# Patient Record
Sex: Male | Born: 1995 | Race: White | Hispanic: No | Marital: Single | Smoking: Never smoker
Health system: Southern US, Community
[De-identification: ages and names within clinical notes are randomized; demographics above are authoritative.]

## PROBLEM LIST (undated history)

## (undated) DIAGNOSIS — J302 Other seasonal allergic rhinitis: Secondary | ICD-10-CM

## (undated) DIAGNOSIS — R011 Cardiac murmur, unspecified: Secondary | ICD-10-CM

## (undated) DIAGNOSIS — J45909 Unspecified asthma, uncomplicated: Secondary | ICD-10-CM

---

## 2020-05-27 ENCOUNTER — Ambulatory Visit (HOSPITAL_COMMUNITY)
Admission: EM | Admit: 2020-05-27 | Discharge: 2020-05-27 | Disposition: A | Discharge status: Home / Self Care | Payer: HRSA Program | Attending: Family Medicine | Admitting: Family Medicine

## 2020-05-27 ENCOUNTER — Encounter (HOSPITAL_COMMUNITY): Payer: Self-pay

## 2020-05-27 ENCOUNTER — Other Ambulatory Visit: Payer: Self-pay

## 2020-05-27 DIAGNOSIS — R6883 Chills (without fever): Secondary | ICD-10-CM | POA: Diagnosis not present

## 2020-05-27 DIAGNOSIS — R519 Headache, unspecified: Secondary | ICD-10-CM | POA: Diagnosis present

## 2020-05-27 DIAGNOSIS — R112 Nausea with vomiting, unspecified: Secondary | ICD-10-CM | POA: Insufficient documentation

## 2020-05-27 DIAGNOSIS — R05 Cough: Secondary | ICD-10-CM | POA: Diagnosis not present

## 2020-05-27 DIAGNOSIS — R0781 Pleurodynia: Secondary | ICD-10-CM | POA: Diagnosis not present

## 2020-05-27 DIAGNOSIS — Z20822 Contact with and (suspected) exposure to covid-19: Secondary | ICD-10-CM | POA: Diagnosis not present

## 2020-05-27 DIAGNOSIS — R059 Cough, unspecified: Secondary | ICD-10-CM

## 2020-05-27 DIAGNOSIS — R197 Diarrhea, unspecified: Secondary | ICD-10-CM | POA: Insufficient documentation

## 2020-05-27 HISTORY — DX: Other seasonal allergic rhinitis: J30.2

## 2020-05-27 HISTORY — DX: Unspecified asthma, uncomplicated: J45.909

## 2020-05-27 HISTORY — DX: Cardiac murmur, unspecified: R01.1

## 2020-05-27 MED ORDER — ONDANSETRON 4 MG PO TBDP
ORAL_TABLET | ORAL | Status: AC
  Filled 2020-05-27: qty 1

## 2020-05-27 MED ORDER — ONDANSETRON 4 MG PO TBDP
4.0000 mg | ORAL_TABLET | Freq: Once | ORAL | Status: AC
  Administered 2020-05-27: 4 mg via ORAL

## 2020-05-27 MED ORDER — BENZONATATE 100 MG PO CAPS
100.0000 mg | ORAL_CAPSULE | Freq: Three times a day (TID) | ORAL | 0 refills | Status: DC
Start: 2020-05-27 — End: 2020-08-29

## 2020-05-27 MED ORDER — ONDANSETRON HCL 4 MG PO TABS
4.0000 mg | ORAL_TABLET | Freq: Four times a day (QID) | ORAL | 0 refills | Status: DC
Start: 2020-05-27 — End: 2020-08-29

## 2020-05-27 NOTE — ED Provider Notes (Signed)
[facility]   [id number] 05/27/20 Arrival Time: 1328   CC: COVID symptoms  SUBJECTIVE: History from: patient.  Dylan Holloway is a 24 y.o. male who presents with abrupt onset of nasal congestion, PND, cough, n/v/d, chills, body aches, headaches since yesterday. Denies having flu or Covid vaccines in the last year. Denies sick exposure to COVID, flu or strep.  Denies recent travel. Has not taken any OTC medications. There are no aggravating symptoms. Denies previous symptoms in the past.   Denies fever, sinus pain, rhinorrhea, sore throat, SOB, wheezing, chest pain, nausea, changes in bowel or bladder habits.    ROS: As per HPI.  All other pertinent ROS negative.     Past Medical History:  Diagnosis Date  . Asthma   . Heart murmur   . Seasonal allergies    History reviewed. No pertinent surgical history. No Known Allergies No current facility-administered medications on file prior to encounter.   Current Outpatient Medications on File Prior to Encounter  Medication Sig Dispense Refill  . cetirizine (ZYRTEC) 10 MG tablet Take 10 mg by mouth daily.     Social History   Socioeconomic History  . Marital status: Single    Spouse name: Not on file  . Number of children: Not on file  . Years of education: Not on file  . Highest education level: Not on file  Occupational History  . Not on file  Tobacco Use  . Smoking status: Never Smoker  . Smokeless tobacco: Never Used  Substance and Sexual Activity  . Alcohol use: Yes    Alcohol/week: 3.0 standard drinks    Types: 3 Cans of beer per week  . Drug use: Yes    Types: Marijuana  . Sexual activity: Not on file  Other Topics Concern  . Not on file  Social History Narrative  . Not on file   Social Determinants of Health   Financial Resource Strain:   . Difficulty of Paying Living Expenses:   Food Insecurity:   . Worried About Programme researcher, broadcasting/film/video in the Last Year:   . Barista in the Last Year:     Transportation Needs:   . Freight forwarder (Medical):   Marland Kitchen Lack of Transportation (Non-Medical):   Physical Activity:   . Days of Exercise per Week:   . Minutes of Exercise per Session:   Stress:   . Feeling of Stress :   Social Connections:   . Frequency of Communication with Friends and Family:   . Frequency of Social Gatherings with Friends and Family:   . Attends Religious Services:   . Active Member of Clubs or Organizations:   . Attends Banker Meetings:   Marland Kitchen Marital Status:   Intimate Partner Violence:   . Fear of Current or Ex-Partner:   . Emotionally Abused:   Marland Kitchen Physically Abused:   . Sexually Abused:    Family History  Problem Relation Age of Onset  . Migraines Mother   . Healthy Father     OBJECTIVE:  Vitals:   05/27/20 1344 05/27/20 1346  BP: 129/71   Pulse: 72   Resp: 16   Temp: 98.2 F (36.8 C)   TempSrc: Oral   SpO2: 100%   Weight:  150 lb (68 kg)  Height:  5\' 11"  (1.803 m)     General appearance: alert; ill appearing, but nontoxic; speaking in full sentences and tolerating own secretions, shivering as I enter the room HEENT: NCAT;  Ears: EACs clear, TMs pearly gray; Eyes: PERRL.  EOM grossly intact. Sinuses: nontender; Nose: nares patent without rhinorrhea, Throat: oropharynx clear, tonsils non erythematous or enlarged, uvula midline  Neck: supple without LAD Lungs: unlabored respirations, symmetrical air entry; cough: mild; no respiratory distress; CTAB Heart: regular rate and rhythm.  Radial pulses 2+ symmetrical bilaterally Skin: warm and dry Psychological: alert and cooperative; normal mood and affect  LABS:  No results found for this or any previous visit (from the past 24 hour(s)).   ASSESSMENT & PLAN:  1. Nonintractable headache, unspecified chronicity pattern, unspecified headache type   2. Chills   3. Non-intractable vomiting with nausea, unspecified vomiting type   4. Diarrhea, unspecified type   5. Rib pain   6.  Cough     Meds ordered this encounter  Medications  . ondansetron (ZOFRAN-ODT) disintegrating tablet 4 mg  . ondansetron (ZOFRAN) 4 MG tablet    Sig: Take 1 tablet (4 mg total) by mouth every 6 (six) hours.    Dispense:  12 tablet    Refill:  0    Order Specific Question:   Supervising Provider    Answer:   Chase Picket [id number]  . benzonatate (TESSALON) 100 MG capsule    Sig: Take 1 capsule (100 mg total) by mouth every 8 (eight) hours.    Dispense:  21 capsule    Refill:  0    Order Specific Question:   Supervising Provider    Answer:   Chase Picket [id number]    Rapid flu negative. Prescribed Zofran Prescribed tessalon perles  COVID testing ordered.  It will take between 1-2 days for test results.  Someone will contact you regarding abnormal results.    Patient should remain in quarantine until they have received Covid results.  If negative you may resume normal activities (go back to work/school) while practicing hand hygiene, social distance, and mask wearing.  If positive, patient should remain in quarantine for 10 days from symptom onset AND greater than 72 hours after symptoms resolution (absence of fever without the use of fever-reducing medication and improvement in respiratory symptoms), whichever is longer Get plenty of rest and push fluids Use OTC zyrtec for nasal congestion, runny nose, and/or sore throat Use OTC flonase for nasal congestion and runny nose Use medications daily for symptom relief Use OTC medications like ibuprofen or tylenol as needed fever or pain Call or go to the ED if you have any new or worsening symptoms such as fever, worsening cough, shortness of breath, chest tightness, chest pain, turning blue, changes in mental status.  Reviewed expectations re: course of current medical issues. Questions answered. Outlined signs and symptoms indicating need for more acute intervention. Patient verbalized understanding. After Visit Summary  given.         Faustino Congress, NP 05/27/20 [phone]

## 2020-05-27 NOTE — ED Triage Notes (Signed)
Pt c/o dizziness, N/V/D, chills, migraine, 7/10 left rib pain with inspiration on and off all started yesterday. Pt states vomited 3-4 times today. Pt states had diarrhea twice today. Pt has non labored breathing. Pt skin color WNL.

## 2020-05-27 NOTE — Discharge Instructions (Signed)
Your COVID test is pending.  You should self quarantine until the test result is back.    Take Tylenol as needed for fever or discomfort.  Rest and keep yourself hydrated.    Go to the emergency department if you develop shortness of breath, severe diarrhea, high fever not relieved by Tylenol or ibuprofen, or other concerning symptoms.    

## 2020-05-28 LAB — SARS CORONAVIRUS 2 (TAT 6-24 HRS): SARS Coronavirus 2: NEGATIVE

## 2020-05-31 LAB — POC INFLUENZA A AND B ANTIGEN (URGENT CARE ONLY)
Influenza A Ag: NEGATIVE
Influenza B Ag: NEGATIVE

## 2020-08-29 ENCOUNTER — Ambulatory Visit (INDEPENDENT_AMBULATORY_CARE_PROVIDER_SITE_OTHER): Payer: 59

## 2020-08-29 ENCOUNTER — Ambulatory Visit
Admission: EM | Admit: 2020-08-29 | Discharge: 2020-08-29 | Disposition: A | Discharge status: Home / Self Care | Payer: 59

## 2020-08-29 DIAGNOSIS — W540XXA Bitten by dog, initial encounter: Secondary | ICD-10-CM | POA: Diagnosis not present

## 2020-08-29 DIAGNOSIS — S61452A Open bite of left hand, initial encounter: Secondary | ICD-10-CM

## 2020-08-29 DIAGNOSIS — M25442 Effusion, left hand: Secondary | ICD-10-CM

## 2020-08-29 MED ORDER — IBUPROFEN 800 MG PO TABS: 800.0000 mg | ORAL_TABLET | Freq: Three times a day (TID) | ORAL | 0 refills | Status: AC

## 2020-08-29 MED ORDER — AMOXICILLIN-POT CLAVULANATE 875-125 MG PO TABS
1.0000 | ORAL_TABLET | Freq: Two times a day (BID) | ORAL | 0 refills | Status: AC
Start: 2020-08-29 — End: ?

## 2020-08-29 NOTE — ED Triage Notes (Signed)
Pt states bite by his dog to his left hand around 3am. Bleeding controlled with swelling noted. Pt c/o tingling to ring finger and can't make a fit.

## 2020-08-29 NOTE — Discharge Instructions (Addendum)
X-ray without fracture Use anti-inflammatories for pain/swelling. You may take up to 800 mg Ibuprofen every 8 hours with food. You may supplement Ibuprofen with Tylenol [phone] mg every 8 hours.  Ice Augmentin twice daily x 1 week for infection Keep clean and dry Follow up with hand if not reganing Range of motion Follow up here or emergency if worsening/signs of infection

## 2020-08-30 NOTE — ED Provider Notes (Signed)
[facility]    CSN: [id number] Arrival date & time: 08/29/20  1812      History   Chief Complaint Chief Complaint  Patient presents with  . Animal Bite    HPI Dylan Holloway is a 24 y.o. male presenting today for evaluation of animal bite.  Patient reports that he was bitten by his dog last night.  He has a pit bull.  Dog is up-to-date on vaccines.  He is concerned as he has had pain swelling and difficulty bending his finger.  Has had some slight tingling around this wound as well.  Denies any drainage.  Believes tetanus up-to-date, but unsure of exact date.  HPI  Past Medical History:  Diagnosis Date  . Asthma   . Heart murmur   . Seasonal allergies     There are no problems to display for this patient.   History reviewed. No pertinent surgical history.     Home Medications    Prior to Admission medications   Medication Sig Start Date End Date Taking? Authorizing Provider  doxycycline (VIBRAMYCIN) 100 MG capsule Take 100 mg by mouth 2 (two) times daily.   Yes [provider]  amoxicillin-clavulanate (AUGMENTIN) 875-125 MG tablet Take 1 tablet by mouth every 12 (twelve) hours. 08/29/20   Dekisha Mesmer C, PA-C  cetirizine (ZYRTEC) 10 MG tablet Take 10 mg by mouth daily.    [provider]  ibuprofen (ADVIL) 800 MG tablet Take 1 tablet (800 mg total) by mouth 3 (three) times daily. 08/29/20   Maralee Higuchi, Junius Creamer, PA-C    Family History Family History  Problem Relation Age of Onset  . Migraines Mother   . Healthy Father     Social History Social History   Tobacco Use  . Smoking status: Never Smoker  . Smokeless tobacco: Never Used  Vaping Use  . Vaping Use: Every day  . Substances: Nicotine  Substance Use Topics  . Alcohol use: Yes    Alcohol/week: 3.0 standard drinks    Types: 3 Cans of beer per week  . Drug use: Yes    Types: Marijuana     Allergies   Patient has no known allergies.   Review of Systems Review of  Systems  Constitutional: Negative for fatigue and fever.  Eyes: Negative for redness, itching and visual disturbance.  Respiratory: Negative for shortness of breath.   Cardiovascular: Negative for chest pain and leg swelling.  Gastrointestinal: Negative for nausea and vomiting.  Musculoskeletal: Positive for arthralgias and joint swelling. Negative for myalgias.  Skin: Positive for wound. Negative for color change and rash.  Neurological: Negative for dizziness, syncope, weakness, light-headedness and headaches.     Physical Exam Triage Vital Signs ED Triage Vitals  Enc Vitals Group     BP 08/29/20 2015 130/74     Pulse Rate 08/29/20 2015 (!) 55     Resp 08/29/20 2015 16     Temp 08/29/20 2015 98.5 F (36.9 C)     Temp Source 08/29/20 2015 Oral     SpO2 08/29/20 2015 98 %     Weight --      Height --      Head Circumference --      Peak Flow --      Pain Score 08/29/20 2030 7     Pain Loc --      Pain Edu? --      Excl. in GC? --    No data found.  Updated Vital  Signs BP 130/74 (BP Location: Right Arm)   Pulse (!) 55   Temp 98.5 F (36.9 C) (Oral)   Resp 16   SpO2 98%   Visual Acuity Right Eye Distance:   Left Eye Distance:   Bilateral Distance:    Right Eye Near:   Left Eye Near:    Bilateral Near:     Physical Exam Vitals and nursing note reviewed.  Constitutional:      Appearance: He is well-developed.     Comments: No acute distress  HENT:     Head: Normocephalic and atraumatic.     Nose: Nose normal.  Eyes:     Conjunctiva/sclera: Conjunctivae normal.  Cardiovascular:     Rate and Rhythm: Normal rate.  Pulmonary:     Effort: Pulmonary effort is normal. No respiratory distress.  Abdominal:     General: There is no distension.  Musculoskeletal:        General: Normal range of motion.     Cervical back: Neck supple.     Comments: Left hand: 1 cm laceration noted over distal fourth MCP, no visualized tendons, swelling or about wound, limited  range of motion at MCP joint, full active range of motion at PIP and DIP Radial pulse 2+  Skin:    General: Skin is warm and dry.  Neurological:     Mental Status: He is alert and oriented to person, place, and time.      UC Treatments / Results  Labs (all labs ordered are listed, but only abnormal results are displayed) Labs Reviewed - No data to display  EKG   Radiology DG Hand Complete Left  Result Date: 08/29/2020 CLINICAL DATA:  Dog bite swelling difficulty clenching EXAM: LEFT HAND - COMPLETE 3+ VIEW COMPARISON:  None. FINDINGS: There is no evidence of fracture or dislocation. There is no evidence of arthropathy or other focal bone abnormality. Soft tissue swelling seen around the fourth digit is seen. No radiopaque foreign body. IMPRESSION: No acute fracture or radiopaque foreign body. Electronically Signed   By: Jonna Clark M.D.   On: 08/29/2020 21:08    Procedures Procedures (including critical care time)  Medications Ordered in UC Medications - No data to display  Initial Impression / Assessment and Plan / UC Course  I have reviewed the triage vital signs and the nursing notes.  Pertinent labs & imaging results that were available during my care of the patient were reviewed by me and considered in my medical decision making (see chart for details).     X-ray negative for acute fracture, suspect range of motion likely more from soft tissue swelling, but recommended to follow-up with hand if not regaining range of motion at MCP joint.  Wound cleansed with saline and Shur-Clens, Steri-Strips applied.  Initiating on Augmentin.  No concern for rabies at this time.  Ice and anti-inflammatories.  Discussed strict return precautions. Patient verbalized understanding and is agreeable with plan.  Final Clinical Impressions(s) / UC Diagnoses   Final diagnoses:  Open wound of left hand due to dog bite     Discharge Instructions     X-ray without fracture Use  anti-inflammatories for pain/swelling. You may take up to 800 mg Ibuprofen every 8 hours with food. You may supplement Ibuprofen with Tylenol [phone] every 8 hours.  Ice Augmentin twice daily x 1 week for infection Keep clean and dry Follow up with hand if not reganing Range of motion Follow up here or emergency if worsening/signs of  infection    ED Prescriptions    Medication Sig Dispense Auth. Provider   amoxicillin-clavulanate (AUGMENTIN) 875-125 MG tablet Take 1 tablet by mouth every 12 (twelve) hours. 14 tablet [place] Leap C, PA-C   ibuprofen (ADVIL) 800 MG tablet Take 1 tablet (800 mg total) by mouth 3 (three) times daily. 21 tablet Marisol Glazer, [place] C, PA-C     PDMP not reviewed this encounter.   Paden Kuras, [place] C, PA-C 08/30/20 1022

## 2022-01-08 IMAGING — DX DG HAND COMPLETE 3+V*L*
3 series · 3 of 3 positions shown · non-contrast
Comparison: None.

CLINICAL DATA: Dog bite swelling difficulty clenching

EXAM:
LEFT HAND - COMPLETE 3+ VIEW

[hand pa]
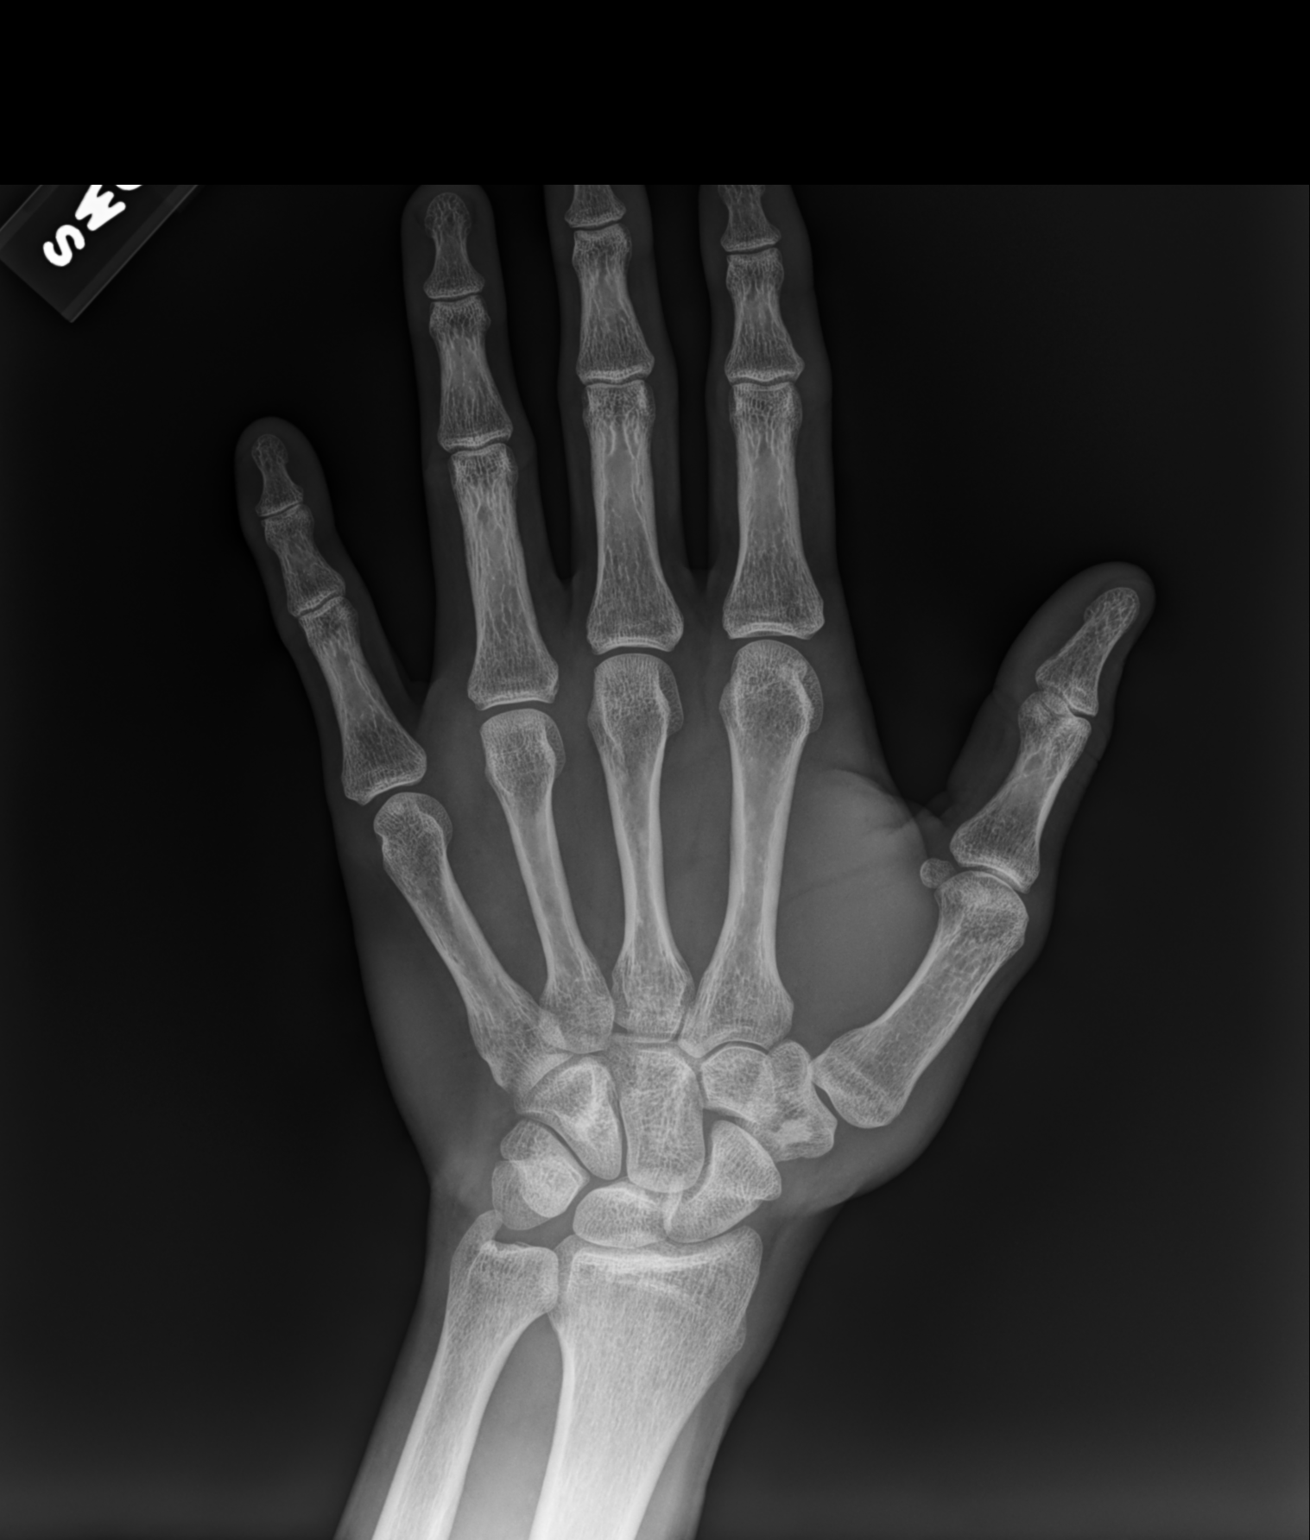

[hand mlo]
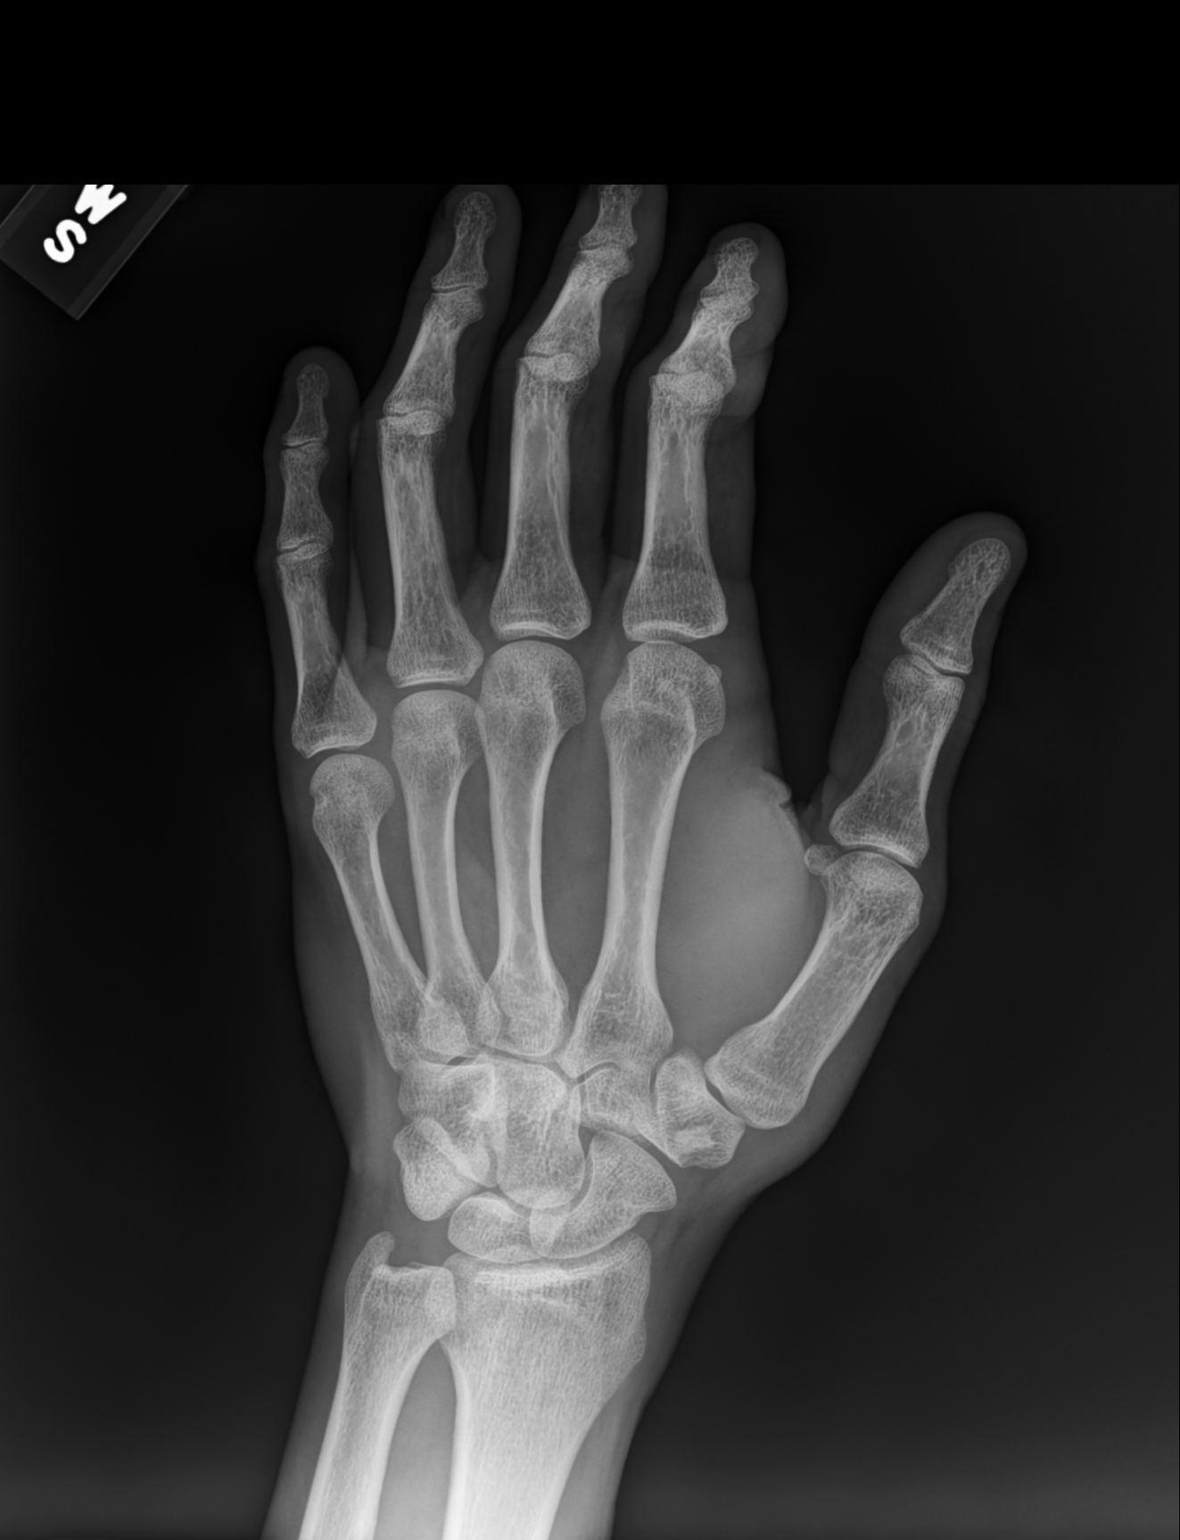

[hand lat]
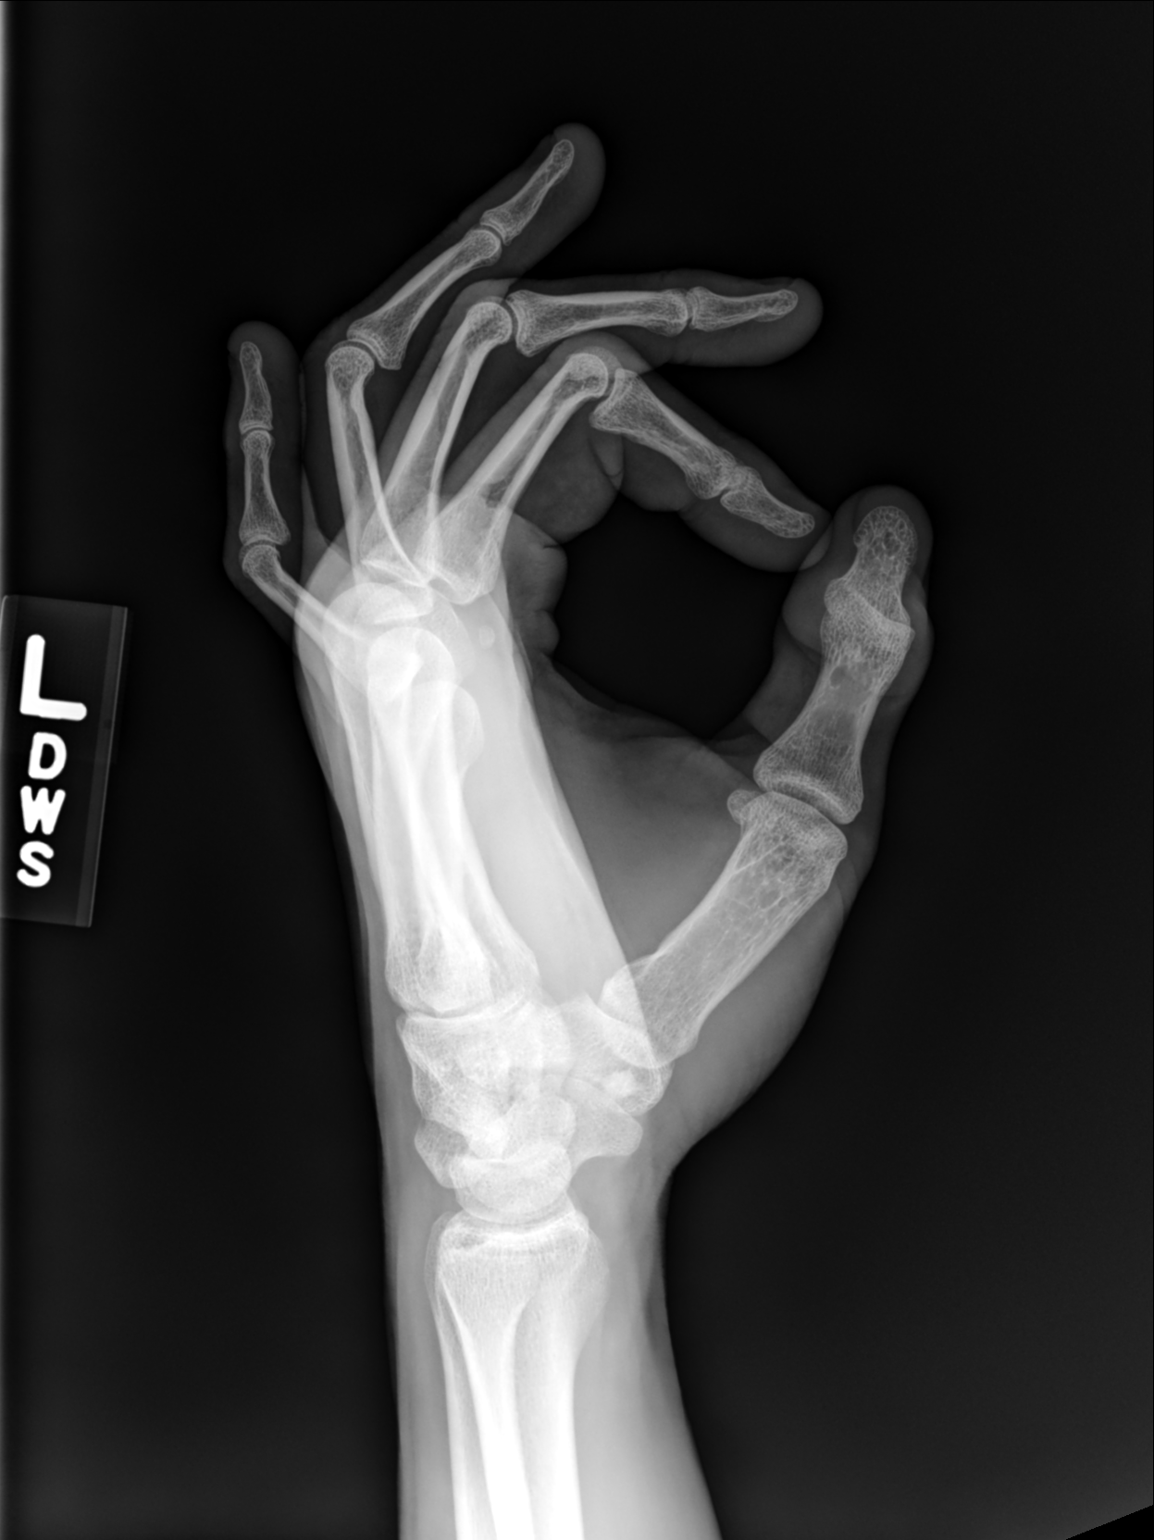

[3 of 3 positions shown; findings below may reference images not displayed]

FINDINGS: There is no evidence of fracture or dislocation. There is no
evidence of arthropathy or other focal bone abnormality. Soft tissue
swelling seen around the fourth digit is seen. No radiopaque foreign
body.
IMPRESSION: No acute fracture or radiopaque foreign body.
